# Patient Record
Sex: Female | Born: 1980 | Race: Black or African American | Hispanic: No | Marital: Married
Health system: Midwestern US, Community
[De-identification: ages and names within clinical notes are randomized; demographics above are authoritative.]

---

## 2010-09-01 ENCOUNTER — Emergency Department (HOSPITAL_COMMUNITY)
Admission: EM | Admit: 2010-09-01 | Discharge: 2010-09-01 | Disposition: A | Discharge status: Home / Self Care | Payer: Medicaid Other | Attending: Emergency Medicine | Admitting: Emergency Medicine

## 2010-09-01 DIAGNOSIS — R300 Dysuria: Secondary | ICD-10-CM | POA: Insufficient documentation

## 2010-09-01 DIAGNOSIS — R109 Unspecified abdominal pain: Secondary | ICD-10-CM | POA: Insufficient documentation

## 2010-09-01 DIAGNOSIS — N390 Urinary tract infection, site not specified: Secondary | ICD-10-CM | POA: Insufficient documentation

## 2010-09-01 LAB — URINALYSIS, ROUTINE W REFLEX MICROSCOPIC
Bilirubin Urine: NEGATIVE
Ketones, ur: NEGATIVE mg/dL
Nitrite: POSITIVE — AB
Protein, ur: 100 mg/dL — AB
Urobilinogen, UA: 1 mg/dL (ref 0.0–1.0)
pH: 7 (ref 5.0–8.0)

## 2010-09-01 LAB — URINE MICROSCOPIC-ADD ON

## 2010-09-01 LAB — POCT PREGNANCY, URINE: Preg Test, Ur: NEGATIVE

## 2010-09-03 LAB — URINE CULTURE
Colony Count: >=100000
Culture  Setup Time: 201207290230

## 2012-01-23 ENCOUNTER — Ambulatory Visit
Admission: RE | Admit: 2012-01-23 | Discharge: 2012-01-23 | Disposition: A | Discharge status: Home / Self Care | Payer: Medicaid Other | Source: Ambulatory Visit | Attending: Physician Assistant | Admitting: Physician Assistant

## 2012-01-23 ENCOUNTER — Other Ambulatory Visit: Payer: Self-pay | Admitting: Physician Assistant

## 2012-01-23 DIAGNOSIS — M545 Low back pain: Secondary | ICD-10-CM

## 2013-09-11 IMAGING — CR DG LUMBAR SPINE COMPLETE 4+V
5 series · 5 of 5 positions shown · non-contrast
Comparison: None.

CLINICAL DATA: Low back pain for 2 years, no injury

LUMBAR SPINE - COMPLETE 4+ VIEW

[view not recorded (1 of 5)]
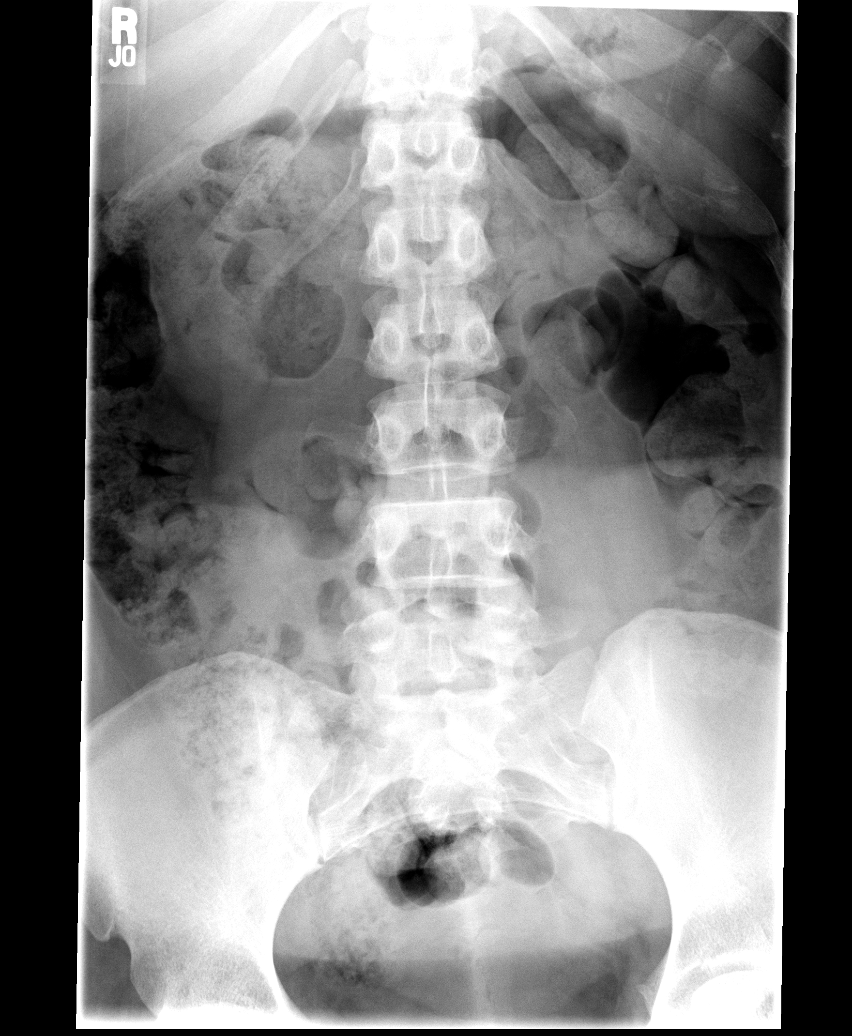

[view not recorded (2 of 5)]
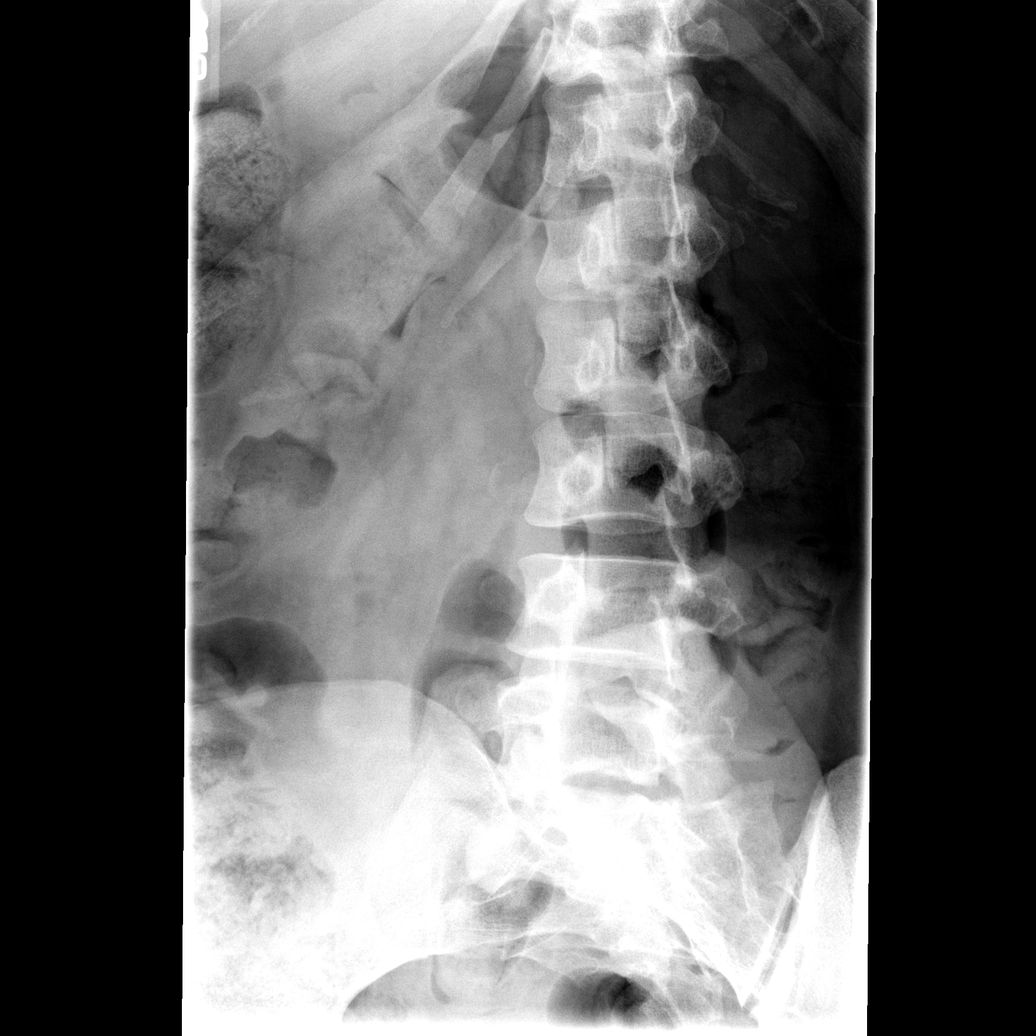

[view not recorded (3 of 5)]
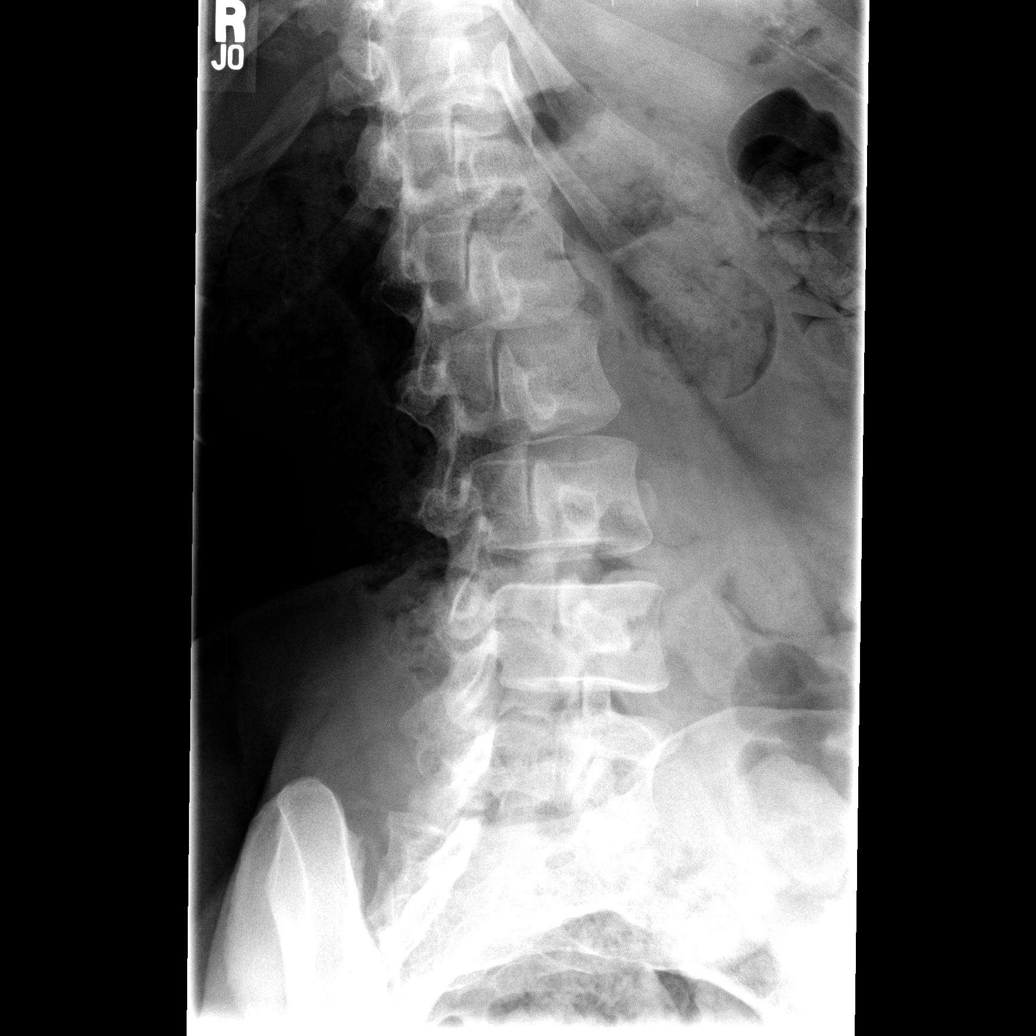

[view not recorded (4 of 5)]
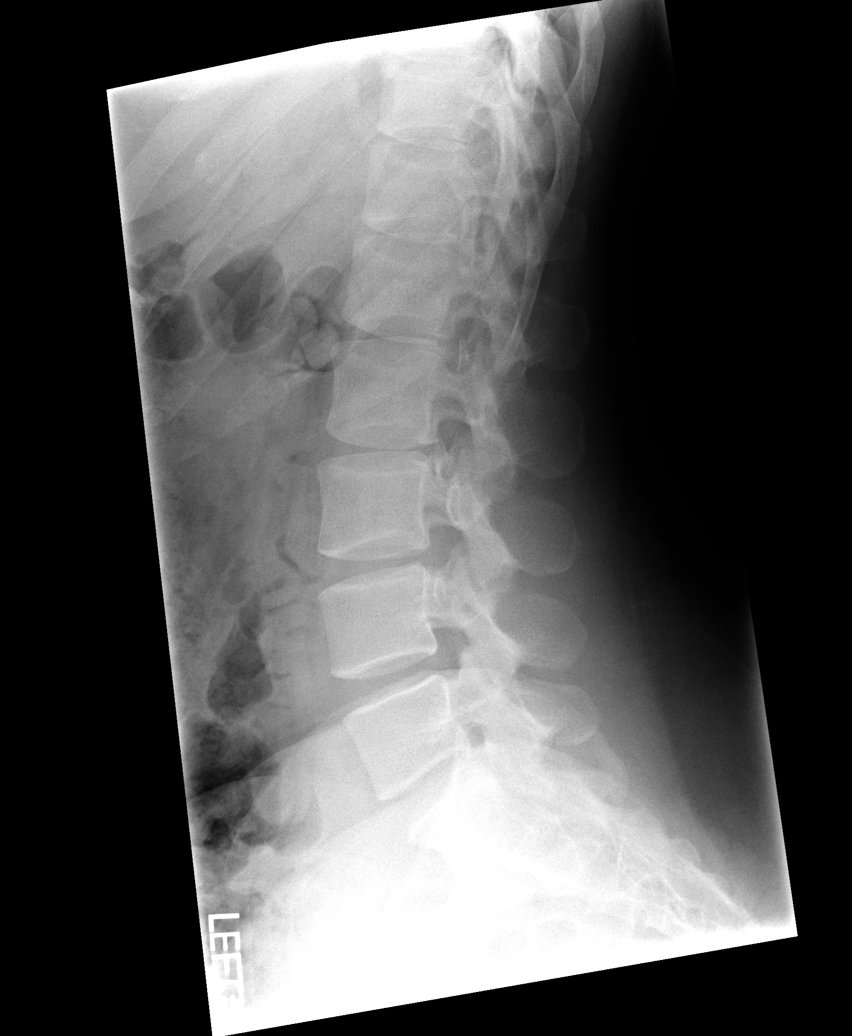

[view not recorded (5 of 5)]
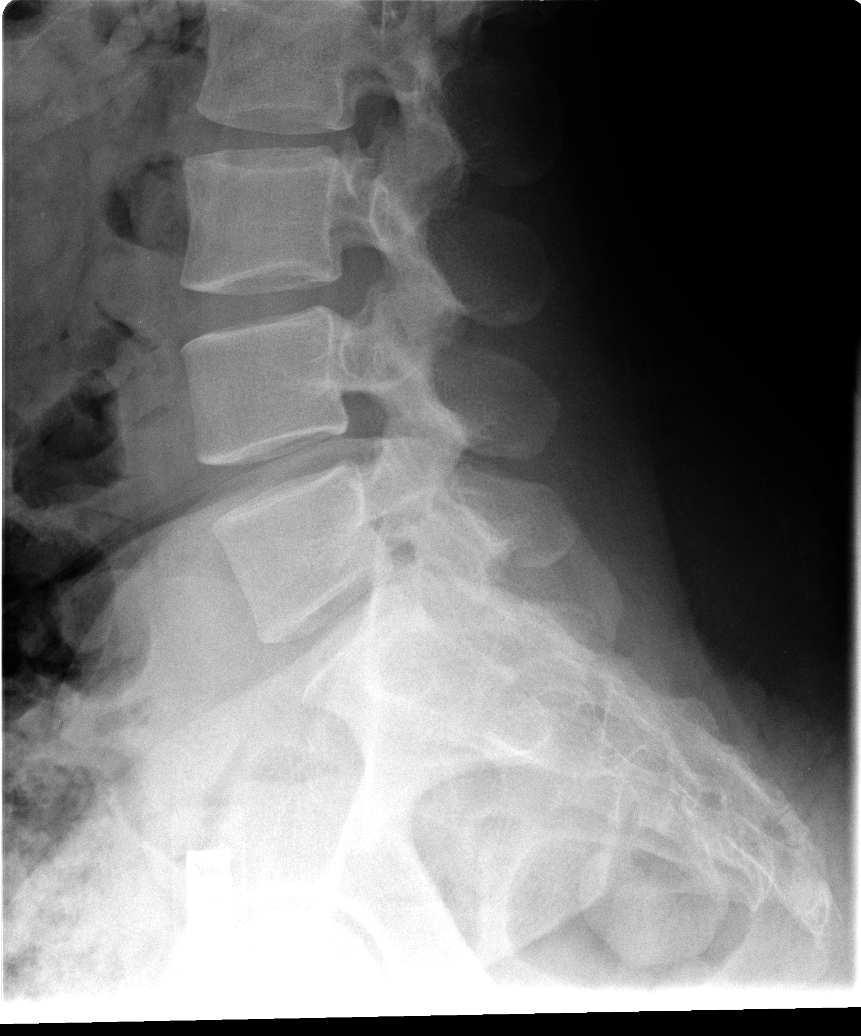

[5 of 5 positions shown; findings below may reference images not displayed]

FINDINGS: The lumbar vertebrae are in normal alignment.
Intervertebral disc spaces appear normal.  No compression deformity
is seen.  No significant degenerative changes noted.  The SI joints
are corticated.  There is a moderate to large amount of feces
throughout the colon.
IMPRESSION: 1.  Normal alignment.  Normal disc spaces.
2.  Moderate to large amount of feces throughout the colon.

## 2016-09-16 ENCOUNTER — Inpatient Hospital Stay
Admit: 2016-09-16 | Discharge: 2016-09-16 | Disposition: A | Discharge status: Home / Self Care | Payer: Medicaid HMO | Attending: Emergency Medicine

## 2016-09-16 DIAGNOSIS — N390 Urinary tract infection, site not specified: Secondary | ICD-10-CM

## 2016-09-16 LAB — METABOLIC PANEL, BASIC
Anion gap: 5 mmol/L (ref 4–12)
BUN: 11 mg/dL (ref 7–18)
CO2: 30 mmol/L (ref 21–32)
Calcium: 9.3 mg/dL (ref 8.5–10.1)
Chloride: 103 mmol/L (ref 98–107)
Creatinine: 1.09 mg/dL — ABNORMAL HIGH (ref 0.55–1.02)
GFR est AA: >60 mL/min/{1.73_m2} (ref >60)
GFR est non-AA: >60 mL/min/{1.73_m2} (ref >60)
Glucose: 121 mg/dL — ABNORMAL HIGH (ref 74–106)
Potassium: 3.4 mmol/L — ABNORMAL LOW (ref 3.5–5.1)
Sodium: 138 mmol/L (ref 136–145)

## 2016-09-16 LAB — CBC WITH AUTOMATED DIFF
ABS. BASOPHILS: 0 10*3/uL (ref 0.0–0.1)
ABS. EOSINOPHILS: 0.2 10*3/uL (ref 0.0–0.2)
ABS. LYMPHOCYTES: 2 10*3/uL (ref 1.3–3.4)
ABS. MONOCYTES: 0.8 10*3/uL (ref 0.1–1.0)
ABS. NEUTROPHILS: 4.5 10*3/uL (ref 2.0–8.1)
BASOPHILS: 0 % (ref 0.0–2.5)
EOSINOPHILS: 2 % (ref 0.0–7.0)
HCT: 36.7 % (ref 36.0–48.0)
HGB: 11.7 g/dL — ABNORMAL LOW (ref 12.0–16.0)
LYMPHOCYTES: 26 % (ref 20.5–51.1)
MCH: 22 PG — ABNORMAL LOW (ref 27.0–31.0)
MCHC: 31.9 g/dL (ref 31.0–37.0)
MCV: 68.9 FL — ABNORMAL LOW (ref 80.0–100.0)
MONOCYTES: 11 % (ref 0.0–12.0)
MPV: 11.6 FL — ABNORMAL HIGH (ref 6.5–9.5)
NEUTROPHILS: 61 % (ref 42.2–75.2)
PLATELET ESTIMATE: ADEQUATE
PLATELET: 156 10*3/uL (ref 130–400)
RBC: 5.33 M/uL (ref 4.20–5.40)
RDW: 15.7 % — ABNORMAL HIGH (ref 11.5–14.0)
WBC: 7.5 10*3/uL (ref 4.8–11.0)

## 2016-09-16 LAB — URINALYSIS W/ RFLX MICROSCOPIC
Bilirubin: NEGATIVE
Glucose: NEGATIVE mg/dL
Ketone: NEGATIVE mg/dL
Nitrites: NEGATIVE
Specific gravity: 1.02
Urobilinogen: 1 EU/dL
pH (UA): 6

## 2016-09-16 LAB — HEPATIC FUNCTION PANEL
A-G Ratio: 0.8 — ABNORMAL LOW (ref 1.0–1.5)
ALT (SGPT): 20 U/L (ref 12–78)
AST (SGOT): 15 U/L (ref 15–37)
Albumin: 3.4 g/dL (ref 3.4–5.0)
Alk. phosphatase: 71 U/L (ref 46–116)
Bilirubin, direct: 0.1 mg/dL (ref 0.1–0.2)
Bilirubin, total: 0.4 mg/dL (ref 0.2–1.0)
Globulin: 4.4 g/dL — ABNORMAL HIGH (ref 2.8–3.9)
Protein, total: 7.8 g/dL (ref 6.4–8.2)

## 2016-09-16 LAB — HCG QL SERUM: HCG, Ql.: NEGATIVE

## 2016-09-16 LAB — URINE MICROSCOPIC: WBC: >100 /hpf

## 2016-09-16 MED ORDER — PHENAZOPYRIDINE 100 MG TAB
100 mg | Freq: Once | ORAL | Status: AC
Start: 2016-09-16 — End: 2016-09-16
  Administered 2016-09-16: 15:00:00 via ORAL

## 2016-09-16 MED ORDER — SODIUM CHLORIDE 0.9% BOLUS IV
0.9 % | Freq: Once | INTRAVENOUS | Status: AC
Start: 2016-09-16 — End: 2016-09-16
  Administered 2016-09-16: 14:00:00 via INTRAVENOUS

## 2016-09-16 MED ORDER — KETOROLAC TROMETHAMINE 30 MG/ML INJECTION
30 mg/mL (1 mL) | INTRAMUSCULAR | Status: AC
Start: 2016-09-16 — End: 2016-09-16
  Administered 2016-09-16: 14:00:00 via INTRAVENOUS

## 2016-09-16 MED ORDER — SODIUM CHLORIDE 0.9 % IV PIGGY BACK
2 gram | INTRAVENOUS | Status: AC
Start: 2016-09-16 — End: 2016-09-16
  Administered 2016-09-16: 14:00:00 via INTRAVENOUS

## 2016-09-16 MED ORDER — PHENAZOPYRIDINE 100 MG TAB
100 mg | ORAL_TABLET | Freq: Three times a day (TID) | ORAL | 0 refills | Status: AC
Start: 2016-09-16 — End: 2016-09-19

## 2016-09-16 MED ORDER — NITROFURANTOIN (25% MACROCRYSTAL FORM) 100 MG CAP
100 mg | ORAL_CAPSULE | Freq: Two times a day (BID) | ORAL | 0 refills | Status: AC
Start: 2016-09-16 — End: 2016-09-23

## 2016-09-16 MED FILL — SODIUM CHLORIDE 0.9 % IV: INTRAVENOUS | Qty: 1000

## 2016-09-16 MED FILL — PHENAZOPYRIDINE 100 MG TAB: 100 mg | ORAL | Qty: 2

## 2016-09-16 MED FILL — KETOROLAC TROMETHAMINE 30 MG/ML INJECTION: 30 mg/mL (1 mL) | INTRAMUSCULAR | Qty: 1

## 2016-09-16 MED FILL — CEFTRIAXONE 2 GRAM SOLUTION FOR INJECTION: 2 gram | INTRAMUSCULAR | Qty: 2

## 2016-09-16 NOTE — ED Notes (Signed)
Pain 8/9 , labs drawn ,ua lab, denies nausea

## 2016-09-16 NOTE — ED Notes (Signed)
Dr Elige Radonallyne in to review test results w/pt and discussed plan of care  Pt verbalized understanding

## 2016-09-16 NOTE — ED Provider Notes (Signed)
HPI Comments: The pt is ambulatory to the ed, fully awake and alert.  The pt c/o dysuria, frequency, urgency x 1 week and now has right cva and flank pain x 2 days.  States the pain is 9/10.  There is no fever and no n/v/d    Patient is a 36 y.o. female presenting with frequency. The history is provided by the patient.   Urinary Frequency    Associated symptoms include frequency, urgency, flank pain (right sided) and abdominal pain. Pertinent negatives include no chills, no nausea and no vomiting.        Past Medical History:   Diagnosis Date   ??? Hypertension    ??? Ill-defined condition     chronic pain       No past surgical history on file.      No family history on file.    Social History     Social History   ??? Marital status: N/A     Spouse name: N/A   ??? Number of children: N/A   ??? Years of education: N/A     Occupational History   ??? Not on file.     Social History Main Topics   ??? Smoking status: Former Smoker   ??? Smokeless tobacco: Not on file   ??? Alcohol use No   ??? Drug use: No   ??? Sexual activity: Not on file     Other Topics Concern   ??? Not on file     Social History Narrative   ??? No narrative on file         ALLERGIES: Review of patient's allergies indicates no known allergies.    Review of Systems   Constitutional: Negative.  Negative for chills, diaphoresis and fever.   HENT: Negative.    Eyes: Negative.    Respiratory: Negative.    Cardiovascular: Negative.    Gastrointestinal: Positive for abdominal pain. Negative for diarrhea, nausea and vomiting.   Genitourinary: Positive for decreased urine volume, dysuria, flank pain (right sided), frequency and urgency.   Skin: Negative.    Neurological: Negative.    Psychiatric/Behavioral: Negative.        Vitals:    09/16/16 0918   BP: 137/85   Pulse: (!) 127   Resp: 15   Temp: 98.5 ??F (36.9 ??C)   SpO2: 98%   Weight: 113.4 kg (250 lb)   Height: '5\' 10"'$  (1.778 m)            Physical Exam   Constitutional: She is oriented to person, place, and time. She appears  well-developed and well-nourished. She appears distressed.   HENT:   Head: Normocephalic and atraumatic.   Right Ear: External ear normal.   Left Ear: External ear normal.   Nose: Nose normal.   Mouth/Throat: Oropharynx is clear and moist.   Eyes: Conjunctivae and EOM are normal. Pupils are equal, round, and reactive to light.   Neck: Normal range of motion. Neck supple.   Cardiovascular: Regular rhythm, normal heart sounds and intact distal pulses.  Tachycardia present.    Pulmonary/Chest: Effort normal and breath sounds normal.   Abdominal: Soft. Bowel sounds are normal. There is tenderness (right flank) in the right lower quadrant and suprapubic area. There is guarding and CVA tenderness (right sided).       Musculoskeletal: Normal range of motion.   Neurological: She is alert and oriented to person, place, and time. She has normal reflexes.   Skin: Skin is warm and dry.  Psychiatric: She has a normal mood and affect. Her behavior is normal. Judgment and thought content normal.   Nursing note and vitals reviewed.       MDM  Number of Diagnoses or Management Options     Amount and/or Complexity of Data Reviewed  Clinical lab tests: ordered and reviewed    Risk of Complications, Morbidity, and/or Mortality  Presenting problems: low  Diagnostic procedures: low  Management options: low    Patient Progress  Patient progress: stable        ED Course       Procedures    9:23 AM  Visit Vitals   ??? BP 137/85 (BP 1 Location: Left arm, BP Patient Position: At rest)   ??? Pulse (!) 127   ??? Temp 98.5 ??F (36.9 ??C)   ??? Resp 15   ??? Ht _0  (1.778 m)   ??? Wt 113.4 kg (250 lb)   ??? SpO2 98%   ??? BMI 35.87 kg/m2       10:52 AM    Results for orders placed or performed during the hospital encounter of 09/16/16   URINALYSIS W/ RFLX MICROSCOPIC   Result Value Ref Range    Color YELLOW      Appearance TURBID      Specific gravity 1.020      pH (UA) 6.0      Protein TRACE mg/dL    Glucose NEGATIVE  mg/dL    Ketone NEGATIVE  mg/dL     Bilirubin NEGATIVE       Blood SMALL      Urobilinogen 1.0 EU/dL    Nitrites NEGATIVE       Leukocyte Esterase LARGE     CBC WITH AUTOMATED DIFF   Result Value Ref Range    WBC 7.5 4.8 - 11.0 K/uL    RBC 5.33 4.20 - 5.40 M/uL    HGB 11.7 (L) 12.0 - 16.0 g/dL    HCT 36.7 36.0 - 48.0 %    MCV 68.9 (L) 80.0 - 100.0 FL    MCH 22.0 (L) 27.0 - 31.0 PG    MCHC 31.9 31.0 - 37.0 g/dL    RDW 15.7 (H) 11.5 - 14.0 %    PLATELET 156 130 - 400 K/uL    MPV 11.6 (H) 6.5 - 9.5 FL    NEUTROPHILS 61 42.2 - 75.2 %    LYMPHOCYTES 26 20.5 - 51.1 %    MONOCYTES 11 0.0 - 12.0 %    EOSINOPHILS 2 0.0 - 7.0 %    BASOPHILS 0 0.0 - 2.5 %    ABS. NEUTROPHILS 4.5 2.0 - 8.1 K/UL    ABS. LYMPHOCYTES 2.0 1.3 - 3.4 K/UL    ABS. MONOCYTES 0.8 0.1 - 1.0 K/UL    ABS. EOSINOPHILS 0.2 0.0 - 0.2 K/UL    ABS. BASOPHILS 0.0 0.0 - 0.1 K/UL    RBC COMMENTS TRACE  MICROCYTOSIS        RBC COMMENTS TRACE  ANISOCYTOSIS        RBC COMMENTS TRACE  OVALOCYTES        PLATELET ESTIMATE ADEQUATE      DF AUTOMATED     HEPATIC FUNCTION PANEL   Result Value Ref Range    Bilirubin, total 0.4 0.2 - 1.0 mg/dL    Bilirubin, direct 0.1 0.1 - 0.2 mg/dL    ALT (SGPT) 20 12 - 78 U/L    AST (SGOT) 15 15 - 37 U/L  Alk. phosphatase 71 46 - 116 U/L    Protein, total 7.8 6.4 - 8.2 g/dL    Albumin 3.4 3.4 - 5.0 g/dL    Globulin 4.4 (H) 2.8 - 3.9 g/dL    A-G Ratio 0.8 (L) 1.0 - 1.5     METABOLIC PANEL, BASIC   Result Value Ref Range    Sodium 138 136 - 145 mmol/L    Potassium 3.4 (L) 3.5 - 5.1 mmol/L    Chloride 103 98 - 107 mmol/L    CO2 30 21 - 32 mmol/L    Anion gap 5 4 - 12 mmol/L    Glucose 121 (H) 74 - 106 mg/dL    BUN 11 7 - 18 mg/dL    Creatinine 1.09 (H) 0.55 - 1.02 mg/dL    GFR est AA >60 >60 ml/min/1.68m    GFR est non-AA >60 >60 ml/min/1.771m   Calcium 9.3 8.5 - 10.1 mg/dL   HCG QL SERUM   Result Value Ref Range    HCG, Ql. NEGATIVE  NEG     URINE MICROSCOPIC   Result Value Ref Range    WBC >100 /hpf    RBC 10-20 (A) NONE /hpf    Epithelial cells 5-10 /hpf     Bacteria 3+ (A) NONE /hpf    Casts NONE NONE /lpf    Crystals, urine NONE NONE /LPF    Mucus 1+ (A) NONE /lpf       The pt remains stable and will be treated with macrobid and pyridium, advil and tylenol  Advised rest and f/u with pmd       Dx--uti

## 2016-09-16 NOTE — ED Notes (Signed)
Discharge instructions given to patient and reviewed.  Patient provided the opportunity to ask questions.  Patient verbalized understanding and signed for discharge.  Patient ambulatory from department with steady gait.

## 2016-09-16 NOTE — ED Triage Notes (Signed)
Pt reports lower abd pain with burning urination for a week.

## 2016-09-16 NOTE — ED Notes (Signed)
Fluid bolus infusing as ordered, antibiotic up , work up in progress

## 2016-09-16 NOTE — Progress Notes (Signed)
Pt put on macrodantin with fu

## 2016-09-19 LAB — CULTURE, URINE: Culture result:: >100000 — AB

## 2017-05-06 ENCOUNTER — Inpatient Hospital Stay: Admit: 2017-05-06 | Primary: Student in an Organized Health Care Education/Training Program

## 2017-05-07 LAB — RUBELLA AB, IGG: Rubella, IgG Qt.: 120.8 IU/ML (ref >9.9)

## 2017-05-07 LAB — RUBEOLA AB, IGG: Rubeola Ab, IgG: >300 AU/mL (ref >29.9)

## 2017-05-09 LAB — MUMPS AB, IGG OCH ONLY-NY: Mumps Ab, IgG: POSITIVE — AB

## 2017-05-09 LAB — VZV AB, IGG: VZV Ab, IgG: POSITIVE — AB

## 2019-02-08 ENCOUNTER — Emergency Department (HOSPITAL_COMMUNITY)
Admission: EM | Admit: 2019-02-08 | Discharge: 2019-02-08 | Discharge status: Against Medical Advice | Payer: Medicaid Other | Attending: Emergency Medicine | Admitting: Emergency Medicine

## 2019-02-08 ENCOUNTER — Other Ambulatory Visit: Payer: Self-pay

## 2019-02-08 ENCOUNTER — Encounter (HOSPITAL_COMMUNITY): Payer: Self-pay | Admitting: *Deleted

## 2019-02-08 DIAGNOSIS — Z5321 Procedure and treatment not carried out due to patient leaving prior to being seen by health care provider: Secondary | ICD-10-CM | POA: Diagnosis not present

## 2019-02-08 DIAGNOSIS — K0889 Other specified disorders of teeth and supporting structures: Secondary | ICD-10-CM | POA: Diagnosis present

## 2019-02-08 NOTE — ED Triage Notes (Signed)
Pt reports right side dental pain and now has swelling to right side of face since yesterday. Airway intact.

## 2023-09-24 ENCOUNTER — Ambulatory Visit: Payer: Self-pay
